# Patient Record
Sex: Male | Born: 2000
Health system: Southern US, Community
[De-identification: ages and names within clinical notes are randomized; demographics above are authoritative.]

## PROBLEM LIST (undated history)

## (undated) DIAGNOSIS — S060XAA Concussion with loss of consciousness status unknown, initial encounter: Secondary | ICD-10-CM

## (undated) DIAGNOSIS — Q798 Other congenital malformations of musculoskeletal system: Secondary | ICD-10-CM

## (undated) DIAGNOSIS — F909 Attention-deficit hyperactivity disorder, unspecified type: Secondary | ICD-10-CM

## (undated) DIAGNOSIS — F458 Other somatoform disorders: Secondary | ICD-10-CM

## (undated) DIAGNOSIS — S060X9A Concussion with loss of consciousness of unspecified duration, initial encounter: Secondary | ICD-10-CM

## (undated) DIAGNOSIS — S8392XS Sprain of unspecified site of left knee, sequela: Secondary | ICD-10-CM

## (undated) DIAGNOSIS — R519 Headache, unspecified: Secondary | ICD-10-CM

## (undated) DIAGNOSIS — Z8781 Personal history of (healed) traumatic fracture: Secondary | ICD-10-CM

## (undated) DIAGNOSIS — M2602 Maxillary hypoplasia: Secondary | ICD-10-CM

## (undated) HISTORY — DX: Attention-deficit hyperactivity disorder, unspecified type: F90.9

## (undated) HISTORY — DX: Sprain of unspecified site of left knee, sequela: S83.92XS

## (undated) HISTORY — DX: Concussion with loss of consciousness of unspecified duration, initial encounter: S06.0X9A

## (undated) HISTORY — DX: Concussion with loss of consciousness status unknown, initial encounter: S06.0XAA

## (undated) HISTORY — DX: Other congenital malformations of musculoskeletal system: Q79.8

## (undated) HISTORY — DX: Maxillary hypoplasia: M26.02

## (undated) HISTORY — DX: Other somatoform disorders: F45.8

## (undated) HISTORY — DX: Headache, unspecified: R51.9

## (undated) HISTORY — DX: Personal history of (healed) traumatic fracture: Z87.81

---
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
## 2021-01-07 DIAGNOSIS — M25512 Pain in left shoulder: Secondary | ICD-10-CM | POA: Diagnosis not present

## 2021-01-08 DIAGNOSIS — M25512 Pain in left shoulder: Secondary | ICD-10-CM | POA: Diagnosis not present

## 2021-01-09 DIAGNOSIS — M25512 Pain in left shoulder: Secondary | ICD-10-CM | POA: Diagnosis not present

## 2021-01-14 DIAGNOSIS — M25512 Pain in left shoulder: Secondary | ICD-10-CM | POA: Diagnosis not present

## 2021-01-16 DIAGNOSIS — M25512 Pain in left shoulder: Secondary | ICD-10-CM | POA: Diagnosis not present

## 2021-03-01 DIAGNOSIS — T22211A Burn of second degree of right forearm, initial encounter: Secondary | ICD-10-CM | POA: Diagnosis not present

## 2021-03-01 DIAGNOSIS — T23251A Burn of second degree of right palm, initial encounter: Secondary | ICD-10-CM | POA: Diagnosis not present

## 2021-03-01 DIAGNOSIS — X150XXA Contact with hot stove (kitchen), initial encounter: Secondary | ICD-10-CM | POA: Diagnosis not present

## 2021-03-03 DIAGNOSIS — T23251D Burn of second degree of right palm, subsequent encounter: Secondary | ICD-10-CM | POA: Diagnosis not present

## 2021-03-03 DIAGNOSIS — T22211D Burn of second degree of right forearm, subsequent encounter: Secondary | ICD-10-CM | POA: Diagnosis not present

## 2021-03-03 DIAGNOSIS — X150XXD Contact with hot stove (kitchen), subsequent encounter: Secondary | ICD-10-CM | POA: Diagnosis not present

## 2021-03-11 DIAGNOSIS — M25512 Pain in left shoulder: Secondary | ICD-10-CM | POA: Diagnosis not present

## 2021-03-19 DIAGNOSIS — M25512 Pain in left shoulder: Secondary | ICD-10-CM | POA: Diagnosis not present

## 2021-05-02 ENCOUNTER — Other Ambulatory Visit: Payer: Self-pay

## 2021-05-02 ENCOUNTER — Encounter (HOSPITAL_COMMUNITY): Payer: Self-pay | Admitting: Emergency Medicine

## 2021-05-02 ENCOUNTER — Emergency Department (HOSPITAL_COMMUNITY)
Admission: EM | Admit: 2021-05-02 | Discharge: 2021-05-03 | Disposition: A | Discharge status: Home / Self Care | Payer: BC Managed Care – PPO | Attending: Emergency Medicine | Admitting: Emergency Medicine

## 2021-05-02 DIAGNOSIS — M62838 Other muscle spasm: Secondary | ICD-10-CM | POA: Insufficient documentation

## 2021-05-02 DIAGNOSIS — Z5321 Procedure and treatment not carried out due to patient leaving prior to being seen by health care provider: Secondary | ICD-10-CM | POA: Diagnosis not present
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
## 2021-05-03 NOTE — ED Notes (Signed)
Patient not visualized in lobby or bathroom 

## 2021-05-11 DIAGNOSIS — R6884 Jaw pain: Secondary | ICD-10-CM | POA: Diagnosis not present

## 2021-05-11 DIAGNOSIS — M26609 Unspecified temporomandibular joint disorder, unspecified side: Secondary | ICD-10-CM | POA: Diagnosis not present

## 2021-05-11 DIAGNOSIS — M2669 Other specified disorders of temporomandibular joint: Secondary | ICD-10-CM | POA: Diagnosis not present

## 2021-07-07 ENCOUNTER — Encounter (HOSPITAL_COMMUNITY): Payer: Self-pay

## 2021-07-07 ENCOUNTER — Emergency Department (HOSPITAL_COMMUNITY)
Admission: EM | Admit: 2021-07-07 | Discharge: 2021-07-07 | Disposition: A | Discharge status: Home / Self Care | Payer: BC Managed Care – PPO | Attending: Emergency Medicine | Admitting: Emergency Medicine

## 2021-07-07 ENCOUNTER — Emergency Department (HOSPITAL_COMMUNITY): Payer: BC Managed Care – PPO

## 2021-07-07 ENCOUNTER — Other Ambulatory Visit: Payer: Self-pay

## 2021-07-07 DIAGNOSIS — Z0389 Encounter for observation for other suspected diseases and conditions ruled out: Secondary | ICD-10-CM | POA: Diagnosis not present

## 2021-07-07 DIAGNOSIS — R0989 Other specified symptoms and signs involving the circulatory and respiratory systems: Secondary | ICD-10-CM | POA: Diagnosis not present

## 2021-07-07 DIAGNOSIS — T189XXA Foreign body of alimentary tract, part unspecified, initial encounter: Secondary | ICD-10-CM | POA: Diagnosis not present

## 2021-07-07 DIAGNOSIS — T17208A Unspecified foreign body in pharynx causing other injury, initial encounter: Secondary | ICD-10-CM | POA: Diagnosis not present

## 2021-07-07 LAB — I-STAT CHEM 8, ED
BUN: 22 mg/dL — ABNORMAL HIGH (ref 6–20)
Calcium, Ion: 1.27 mmol/L (ref 1.15–1.40)
Chloride: 100 mmol/L (ref 98–111)
Creatinine, Ser: 1.2 mg/dL (ref 0.61–1.24)
Glucose, Bld: 78 mg/dL (ref 70–99)
HCT: 49 % (ref 39.0–52.0)
Hemoglobin: 16.7 g/dL (ref 13.0–17.0)
Potassium: 4.2 mmol/L (ref 3.5–5.1)
Sodium: 139 mmol/L (ref 135–145)
TCO2: 30 mmol/L (ref 22–32)

## 2021-07-07 LAB — I-STAT CREATININE, ED: Creatinine, Ser: 1.2 mg/dL (ref 0.61–1.24)

## 2021-07-07 MED ORDER — PANTOPRAZOLE SODIUM 20 MG PO TBEC: 20.0000 mg | DELAYED_RELEASE_TABLET | Freq: Every day | ORAL | 0 refills | Status: AC

## 2021-07-07 MED ORDER — IOHEXOL 350 MG/ML SOLN
75.0000 mL | Freq: Once | INTRAVENOUS | Status: AC | PRN
  Administered 2021-07-07: 60 mL via INTRAVENOUS

## 2021-07-07 MED ORDER — SUCRALFATE 1 GM/10ML PO SUSP: 1.0000 g | Freq: Three times a day (TID) | ORAL | 0 refills | Status: AC

## 2021-07-07 NOTE — ED Provider Notes (Signed)
Emergency Medicine Provider Triage Evaluation Note  Shane Cummings , a 20 y.o. male  was evaluated in triage.  Pt complains of following glass.  States that this morning he was getting in his car with a glass of juice when it fell and broke.  He states that he picked it up and poured the rest of the juice into another cup that he had and took a sip.  States that he feels like there was glass in it and there was a sharp sensation in his throat.  He still has the foreign body/sharp sensation in his throat.  He denies coughing up blood.  He has since eaten and had solid foods.  Denies shortness of breath.  Review of Systems  Positive:  Negative:   Physical Exam  BP 117/80 (BP Location: Left Arm)   Pulse 68   Temp 98 F (36.7 C) (Oral)   Resp 18   SpO2 93%  Gen:   Awake, no distress   Resp:  Normal effort  MSK:   Moves extremities without difficulty  Other:  Oropharynx is clear without erythema or blood present.  No foreign bodies in the oropharynx appreciated.  He does have left-sided nontender cervical adenopathy which is near where he feels the foreign body sensation.  Medical Decision Making  Medically screening exam initiated at 1:15 PM.  Appropriate orders placed.  DEYLAN CANTERBURY was informed that the remainder of the evaluation will be completed by another provider, this initial triage assessment does not replace that evaluation, and the importance of remaining in the ED until their evaluation is complete.     Cristopher Peru, PA-C 07/07/21 1317    Benjiman Core, MD 07/08/21 [phone]

## 2021-07-07 NOTE — Discharge Instructions (Signed)
Eat soft foods for the next few days.  Follow-up with a gastroenterologist as discussed.  Return to emergency room if you have any worsening pain/abdominal pain, vomiting, fevers, difficulty swallowing or other worsening symptoms.
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
## 2021-07-07 NOTE — ED Notes (Signed)
Pt states understanding of dc instructions, importance of follow up, and prescriptions. Pt denies questions or concerns upon dc. Pt declined wheelchair assistance upon dc. Pt ambulated out of ed w/ steady gait. No belongings left in room upon dc.  
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
## 2021-07-27 DIAGNOSIS — M26601 Right temporomandibular joint disorder, unspecified: Secondary | ICD-10-CM | POA: Diagnosis not present

## 2021-07-27 DIAGNOSIS — R252 Cramp and spasm: Secondary | ICD-10-CM | POA: Diagnosis not present

## 2021-08-28 DIAGNOSIS — B079 Viral wart, unspecified: Secondary | ICD-10-CM | POA: Diagnosis not present

## 2021-08-28 DIAGNOSIS — D485 Neoplasm of uncertain behavior of skin: Secondary | ICD-10-CM | POA: Diagnosis not present

## 2021-11-03 DIAGNOSIS — M2391 Unspecified internal derangement of right knee: Secondary | ICD-10-CM | POA: Diagnosis not present

## 2021-11-03 DIAGNOSIS — M25561 Pain in right knee: Secondary | ICD-10-CM | POA: Diagnosis not present

## 2021-11-10 DIAGNOSIS — M2391 Unspecified internal derangement of right knee: Secondary | ICD-10-CM | POA: Diagnosis not present

## 2021-11-13 DIAGNOSIS — S83421A Sprain of lateral collateral ligament of right knee, initial encounter: Secondary | ICD-10-CM | POA: Diagnosis not present

## 2021-11-13 DIAGNOSIS — M2391 Unspecified internal derangement of right knee: Secondary | ICD-10-CM | POA: Diagnosis not present

## 2021-11-13 DIAGNOSIS — S8001XA Contusion of right knee, initial encounter: Secondary | ICD-10-CM | POA: Diagnosis not present

## 2022-01-05 DIAGNOSIS — M25561 Pain in right knee: Secondary | ICD-10-CM | POA: Diagnosis not present

## 2022-01-14 DIAGNOSIS — M25561 Pain in right knee: Secondary | ICD-10-CM | POA: Diagnosis not present

## 2022-01-14 DIAGNOSIS — S83421A Sprain of lateral collateral ligament of right knee, initial encounter: Secondary | ICD-10-CM | POA: Diagnosis not present

## 2022-01-14 DIAGNOSIS — S83521A Sprain of posterior cruciate ligament of right knee, initial encounter: Secondary | ICD-10-CM | POA: Diagnosis not present
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
## 2022-02-02 DIAGNOSIS — M25561 Pain in right knee: Secondary | ICD-10-CM | POA: Diagnosis not present

## 2022-02-03 DIAGNOSIS — M542 Cervicalgia: Secondary | ICD-10-CM | POA: Diagnosis not present

## 2022-08-19 DIAGNOSIS — L74510 Primary focal hyperhidrosis, axilla: Secondary | ICD-10-CM | POA: Diagnosis not present

## 2022-11-26 DIAGNOSIS — K567 Ileus, unspecified: Secondary | ICD-10-CM | POA: Diagnosis not present

## 2022-11-26 DIAGNOSIS — Z978 Presence of other specified devices: Secondary | ICD-10-CM | POA: Diagnosis not present

## 2022-11-26 DIAGNOSIS — S31601A Unspecified open wound of abdominal wall, left upper quadrant with penetration into peritoneal cavity, initial encounter: Secondary | ICD-10-CM | POA: Diagnosis not present

## 2022-11-26 DIAGNOSIS — S3639XA Other injury of stomach, initial encounter: Secondary | ICD-10-CM | POA: Diagnosis not present

## 2022-11-26 DIAGNOSIS — S3510XA Unspecified injury of inferior vena cava, initial encounter: Secondary | ICD-10-CM | POA: Diagnosis not present

## 2022-11-26 DIAGNOSIS — S36200D Unspecified injury of head of pancreas, subsequent encounter: Secondary | ICD-10-CM | POA: Diagnosis not present

## 2022-11-26 DIAGNOSIS — S31631A Puncture wound without foreign body of abdominal wall, left upper quadrant with penetration into peritoneal cavity, initial encounter: Secondary | ICD-10-CM | POA: Diagnosis not present

## 2022-11-26 DIAGNOSIS — S31619A Laceration without foreign body of abdominal wall, unspecified quadrant with penetration into peritoneal cavity, initial encounter: Secondary | ICD-10-CM | POA: Diagnosis not present

## 2022-11-26 DIAGNOSIS — S36200A Unspecified injury of head of pancreas, initial encounter: Secondary | ICD-10-CM | POA: Diagnosis not present

## 2022-11-26 DIAGNOSIS — I959 Hypotension, unspecified: Secondary | ICD-10-CM | POA: Diagnosis not present

## 2022-11-26 DIAGNOSIS — S31101A Unspecified open wound of abdominal wall, left upper quadrant without penetration into peritoneal cavity, initial encounter: Secondary | ICD-10-CM | POA: Diagnosis not present

## 2022-11-26 DIAGNOSIS — D62 Acute posthemorrhagic anemia: Secondary | ICD-10-CM | POA: Diagnosis not present

## 2022-11-26 DIAGNOSIS — Z781 Physical restraint status: Secondary | ICD-10-CM | POA: Diagnosis not present

## 2022-11-26 DIAGNOSIS — S3633XA Laceration of stomach, initial encounter: Secondary | ICD-10-CM | POA: Diagnosis not present

## 2022-11-26 DIAGNOSIS — R200 Anesthesia of skin: Secondary | ICD-10-CM | POA: Diagnosis not present

## 2022-11-26 DIAGNOSIS — W269XXA Contact with unspecified sharp object(s), initial encounter: Secondary | ICD-10-CM | POA: Diagnosis not present

## 2022-11-26 DIAGNOSIS — Z4659 Encounter for fitting and adjustment of other gastrointestinal appliance and device: Secondary | ICD-10-CM | POA: Diagnosis not present

## 2022-11-26 DIAGNOSIS — S31111A Laceration without foreign body of abdominal wall, left upper quadrant without penetration into peritoneal cavity, initial encounter: Secondary | ICD-10-CM | POA: Diagnosis not present

## 2022-11-26 DIAGNOSIS — K683 Retroperitoneal hematoma: Secondary | ICD-10-CM | POA: Diagnosis not present

## 2022-11-26 DIAGNOSIS — Z9889 Other specified postprocedural states: Secondary | ICD-10-CM | POA: Diagnosis not present

## 2022-11-26 DIAGNOSIS — S31139A Puncture wound of abdominal wall without foreign body, unspecified quadrant without penetration into peritoneal cavity, initial encounter: Secondary | ICD-10-CM | POA: Diagnosis not present

## 2022-11-26 DIAGNOSIS — Z4682 Encounter for fitting and adjustment of non-vascular catheter: Secondary | ICD-10-CM | POA: Diagnosis not present

## 2022-11-26 DIAGNOSIS — R918 Other nonspecific abnormal finding of lung field: Secondary | ICD-10-CM | POA: Diagnosis not present

## 2022-11-26 DIAGNOSIS — S35239A Unspecified injury of inferior mesenteric artery, initial encounter: Secondary | ICD-10-CM | POA: Diagnosis not present

## 2022-11-26 DIAGNOSIS — T148XXA Other injury of unspecified body region, initial encounter: Secondary | ICD-10-CM | POA: Diagnosis not present

## 2022-11-26 DIAGNOSIS — S31119A Laceration without foreign body of abdominal wall, unspecified quadrant without penetration into peritoneal cavity, initial encounter: Secondary | ICD-10-CM | POA: Diagnosis not present

## 2022-11-26 DIAGNOSIS — S36230A Laceration of head of pancreas, unspecified degree, initial encounter: Secondary | ICD-10-CM | POA: Diagnosis not present

## 2022-11-26 DIAGNOSIS — N179 Acute kidney failure, unspecified: Secondary | ICD-10-CM | POA: Diagnosis not present

## 2022-11-26 DIAGNOSIS — K8689 Other specified diseases of pancreas: Secondary | ICD-10-CM | POA: Diagnosis not present

## 2022-11-26 DIAGNOSIS — M546 Pain in thoracic spine: Secondary | ICD-10-CM | POA: Diagnosis not present

## 2022-11-26 DIAGNOSIS — S3519XA Other injury of inferior vena cava, initial encounter: Secondary | ICD-10-CM | POA: Diagnosis not present

## 2022-11-26 DIAGNOSIS — T1490XA Injury, unspecified, initial encounter: Secondary | ICD-10-CM | POA: Diagnosis not present

## 2022-12-04 ENCOUNTER — Other Ambulatory Visit: Payer: Self-pay | Admitting: Family Medicine

## 2022-12-04 ENCOUNTER — Ambulatory Visit
Admission: RE | Admit: 2022-12-04 | Discharge: 2022-12-04 | Disposition: A | Discharge status: Home / Self Care | Payer: BC Managed Care – PPO | Source: Ambulatory Visit | Attending: Family Medicine | Admitting: Family Medicine

## 2022-12-04 DIAGNOSIS — I872 Venous insufficiency (chronic) (peripheral): Secondary | ICD-10-CM | POA: Diagnosis not present

## 2022-12-04 DIAGNOSIS — I808 Phlebitis and thrombophlebitis of other sites: Secondary | ICD-10-CM | POA: Diagnosis not present

## 2022-12-04 DIAGNOSIS — Q453 Other congenital malformations of pancreas and pancreatic duct: Secondary | ICD-10-CM | POA: Diagnosis not present

## 2022-12-04 DIAGNOSIS — M79602 Pain in left arm: Secondary | ICD-10-CM | POA: Diagnosis not present

## 2022-12-10 DIAGNOSIS — T148XXA Other injury of unspecified body region, initial encounter: Secondary | ICD-10-CM | POA: Diagnosis not present

## 2022-12-10 DIAGNOSIS — Z4802 Encounter for removal of sutures: Secondary | ICD-10-CM | POA: Diagnosis not present

## 2022-12-10 DIAGNOSIS — T1490XA Injury, unspecified, initial encounter: Secondary | ICD-10-CM | POA: Diagnosis not present

## 2023-08-16 DIAGNOSIS — R109 Unspecified abdominal pain: Secondary | ICD-10-CM | POA: Diagnosis not present

## 2023-11-22 DIAGNOSIS — L74510 Primary focal hyperhidrosis, axilla: Secondary | ICD-10-CM | POA: Diagnosis not present
